# Patient Record
Sex: Female | Born: 1970
Health system: Southern US, Community
[De-identification: ages and names within clinical notes are randomized; demographics above are authoritative.]

## PROBLEM LIST (undated history)

## (undated) DIAGNOSIS — D649 Anemia, unspecified: Secondary | ICD-10-CM

## (undated) DIAGNOSIS — L719 Rosacea, unspecified: Secondary | ICD-10-CM

## (undated) DIAGNOSIS — E7800 Pure hypercholesterolemia, unspecified: Secondary | ICD-10-CM

## (undated) DIAGNOSIS — F419 Anxiety disorder, unspecified: Secondary | ICD-10-CM

## (undated) DIAGNOSIS — G43909 Migraine, unspecified, not intractable, without status migrainosus: Secondary | ICD-10-CM

## (undated) DIAGNOSIS — N946 Dysmenorrhea, unspecified: Secondary | ICD-10-CM

## (undated) HISTORY — DX: Pure hypercholesterolemia, unspecified: E78.00

## (undated) HISTORY — DX: Anemia, unspecified: D64.9

## (undated) HISTORY — DX: Anxiety disorder, unspecified: F41.9

## (undated) HISTORY — DX: Migraine, unspecified, not intractable, without status migrainosus: G43.909

## (undated) HISTORY — DX: Dysmenorrhea, unspecified: N94.6

## (undated) HISTORY — DX: Rosacea, unspecified: L71.9

---
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
## 2002-10-07 ENCOUNTER — Other Ambulatory Visit: Admission: RE | Admit: 2002-10-07 | Discharge: 2002-10-07 | Payer: Self-pay | Admitting: Family Medicine

## 2004-01-03 ENCOUNTER — Other Ambulatory Visit: Admission: RE | Admit: 2004-01-03 | Discharge: 2004-01-03 | Payer: Self-pay | Admitting: Family Medicine

## 2006-05-22 ENCOUNTER — Other Ambulatory Visit: Admission: RE | Admit: 2006-05-22 | Discharge: 2006-05-22 | Payer: Self-pay | Admitting: Family Medicine

## 2006-10-19 ENCOUNTER — Emergency Department (HOSPITAL_COMMUNITY): Admission: EM | Admit: 2006-10-19 | Discharge: 2006-10-19 | Payer: Self-pay | Admitting: Emergency Medicine

## 2007-11-11 ENCOUNTER — Other Ambulatory Visit: Admission: RE | Admit: 2007-11-11 | Discharge: 2007-11-11 | Payer: Self-pay | Admitting: Family Medicine

## 2010-02-07 ENCOUNTER — Other Ambulatory Visit: Admission: RE | Admit: 2010-02-07 | Discharge: 2010-02-07 | Payer: Self-pay | Admitting: Family Medicine

## 2010-08-31 ENCOUNTER — Encounter: Admission: RE | Admit: 2010-08-31 | Discharge: 2010-08-31 | Payer: Self-pay | Admitting: Family Medicine

## 2011-12-30 IMAGING — US US TRANSVAGINAL NON-OB
1 series · 13 of 25 positions shown · non-contrast
Comparison: None.

CLINICAL DATA: Right adnexal mass with abdominal and pelvic
tenderness.  LMP 08/15/2010



[Series 1: us transvaginal non-ob · 0.31mm/px · 13 of 63 slices shown]
[im 1/63]
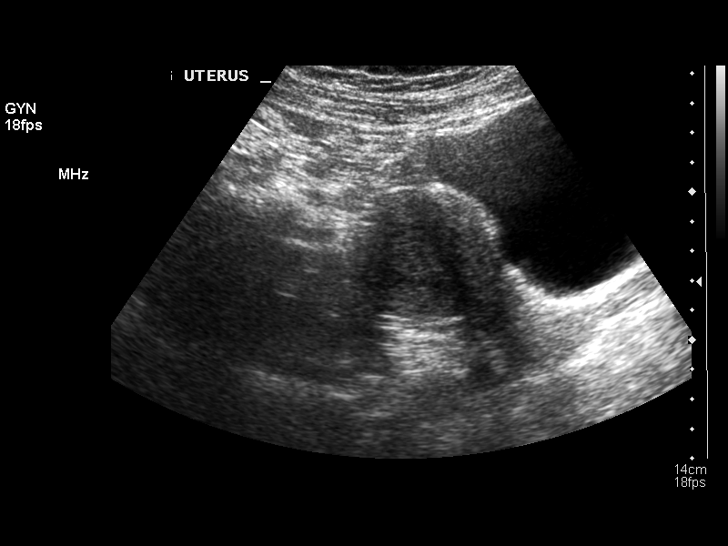
[im 6/63]
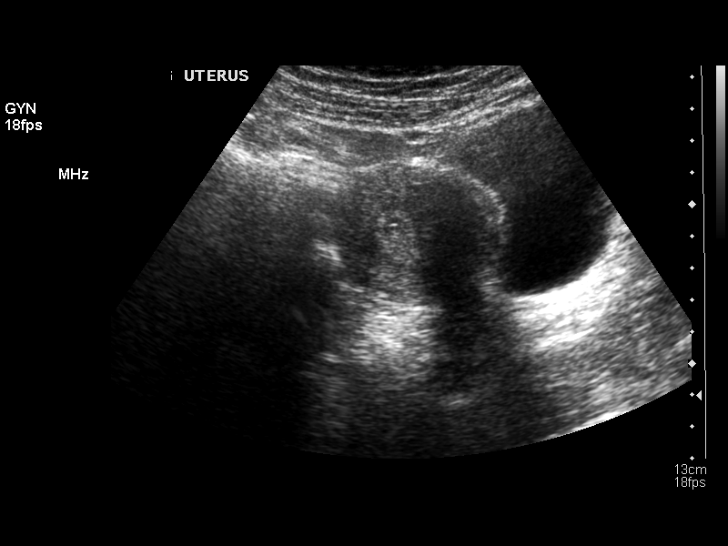
[im 11/63]
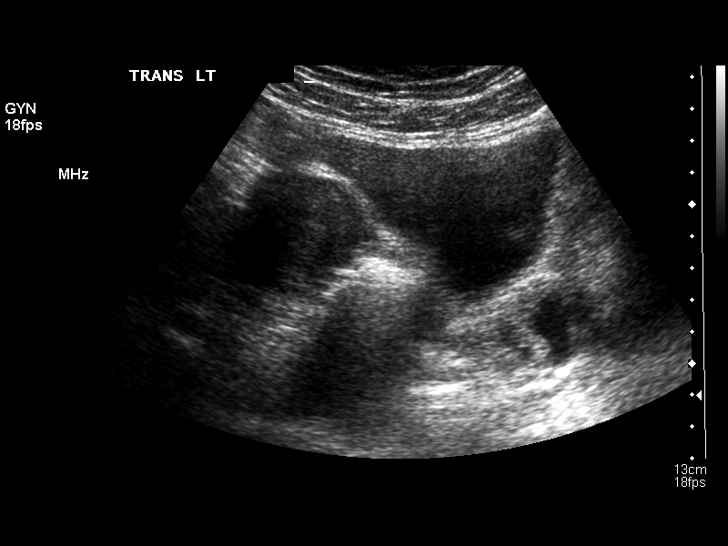
[im 16/63]
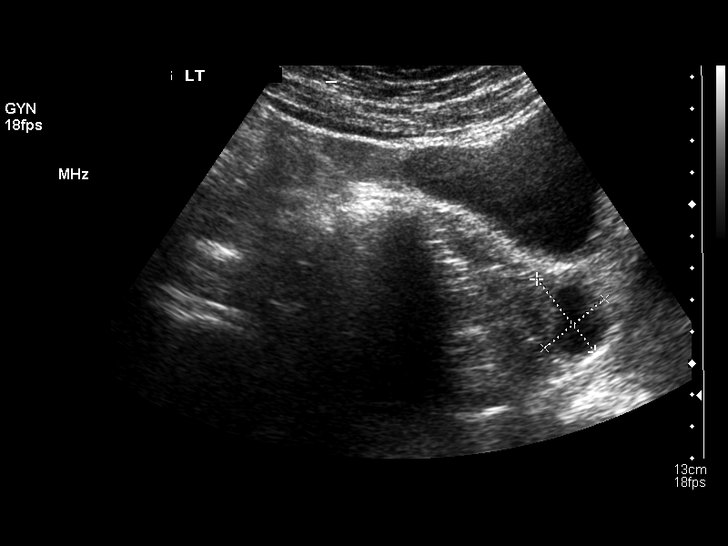
[im 21/63]
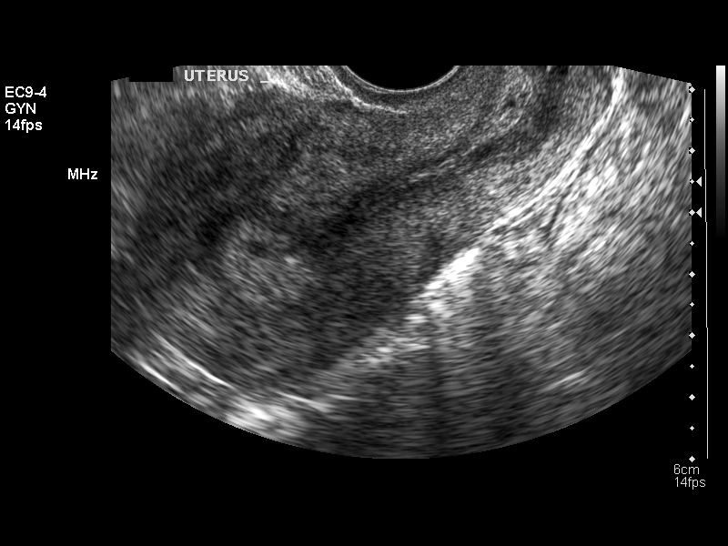
[im 26/63]
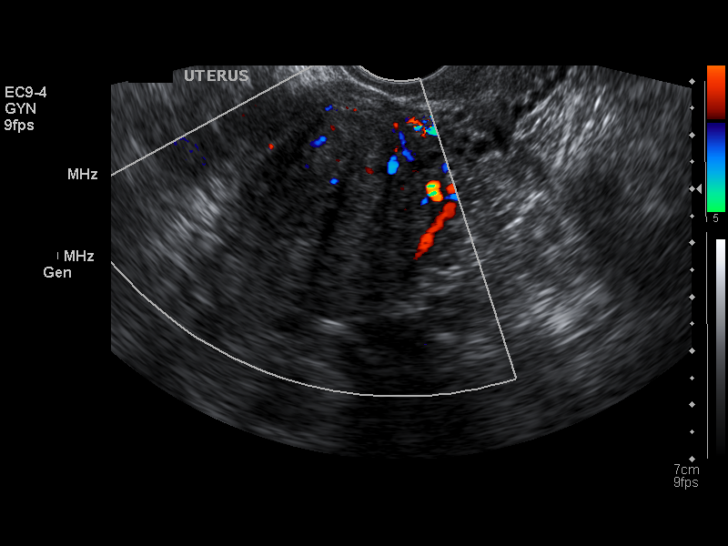
[im 32/63]
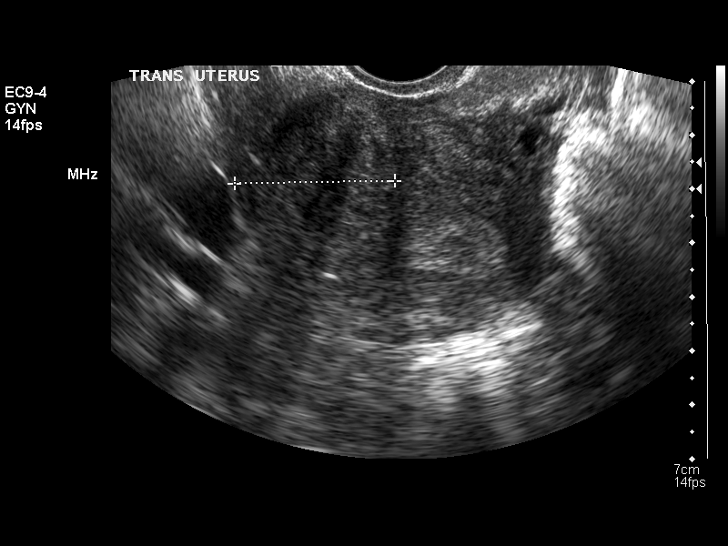
[im 37/63]
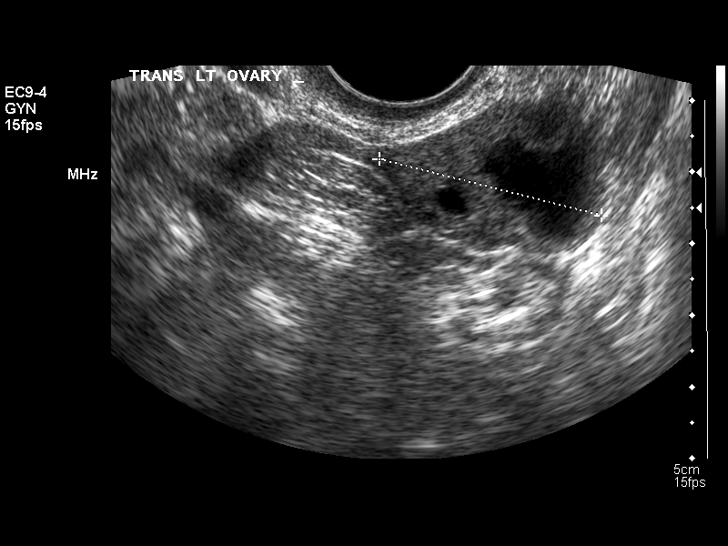
[im 42/63]
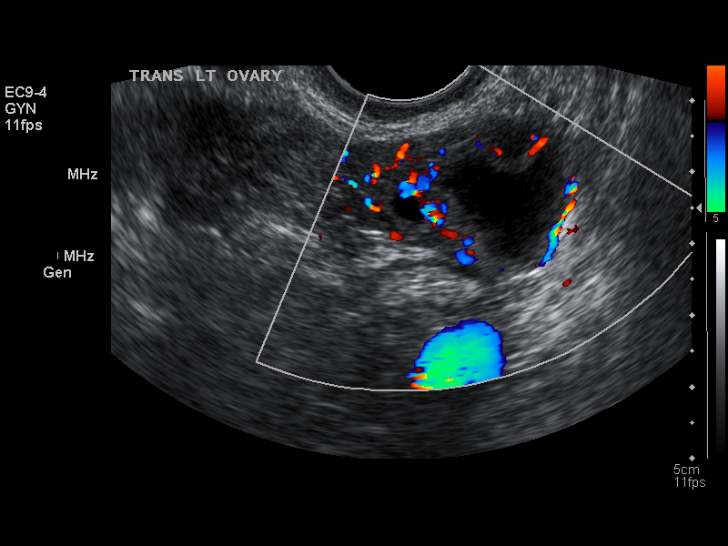
[im 47/63]
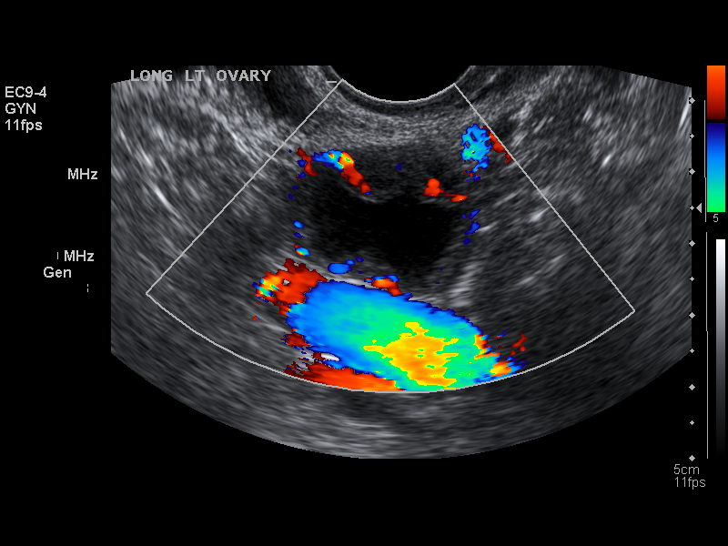
[im 52/63]
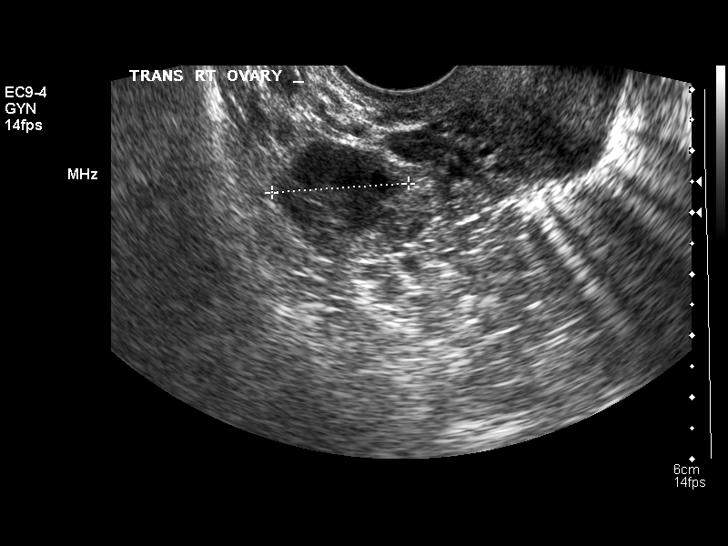
[im 57/63]
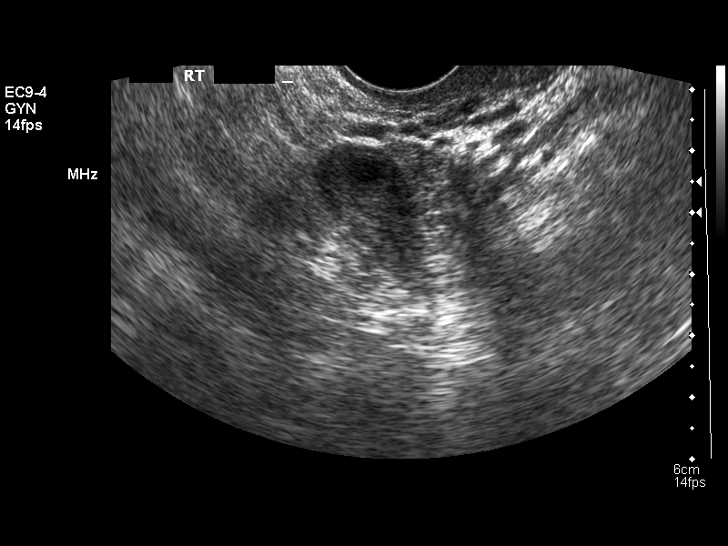
[im 63/63]
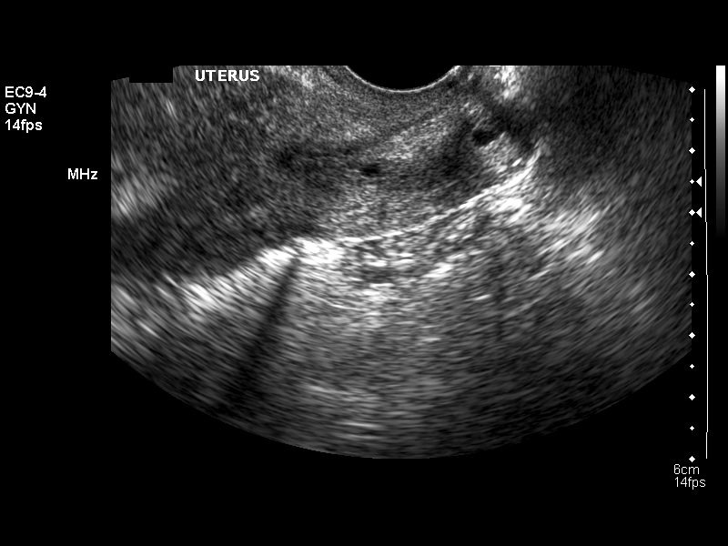

[13 of 25 positions shown; findings below may reference images not displayed]

FINDINGS: Uterus the uterus has a sagittal length of 8.7 cm, an AP depth of
4.7 cm and a transverse width of 6.0 cm.  An area of focally
altered echotexture is identified in the right anterolateral upper
uterine segment measuring 3.5 x 2.7 x 3.0 cm and compatible with a
focal mural fibroid.  The remainder of the myometrium is
homogeneous.

Endometrium has a late tri- layered appearance with an AP width of
10.6 mm.  No areas of focal thickening or heterogeneity are seen
and this would correlate with a late periovulatory endometrial
stripe and correspond with the patient's given LMP of 08/15/2010.

Right Ovary has a normal appearance measuring 2.2 x 2.5 x 1.4 cm

Left Ovary measures 2.8 x 2.2 x 2.2 cm and contains a collapsing
corpus luteum

Other Findings:  No pelvic fluid or separate adnexal masses are
noted.
IMPRESSION: Focal fibroid with sizes location as noted above.

Normal periovulatory endometrial stripe and ovaries.

If the clinical impression of a right adnexal mass persists and is
not felt to correlate with the location or size of the fibroid seen
today, further evaluation of the non gynecologic aspects of the
pelvis can be undertaken with CT.

## 2015-10-31 ENCOUNTER — Other Ambulatory Visit (HOSPITAL_COMMUNITY)
Admission: RE | Admit: 2015-10-31 | Discharge: 2015-10-31 | Disposition: A | Discharge status: Home / Self Care | Payer: 59 | Source: Ambulatory Visit | Attending: Family Medicine | Admitting: Family Medicine

## 2015-10-31 ENCOUNTER — Other Ambulatory Visit: Payer: Self-pay | Admitting: Family Medicine

## 2015-10-31 DIAGNOSIS — Z1151 Encounter for screening for human papillomavirus (HPV): Secondary | ICD-10-CM | POA: Diagnosis present

## 2015-10-31 DIAGNOSIS — Z01419 Encounter for gynecological examination (general) (routine) without abnormal findings: Secondary | ICD-10-CM | POA: Insufficient documentation

## 2015-11-04 LAB — CYTOLOGY - PAP

## 2016-03-15 DIAGNOSIS — L719 Rosacea, unspecified: Secondary | ICD-10-CM | POA: Diagnosis not present

## 2016-03-15 DIAGNOSIS — D1800 Hemangioma unspecified site: Secondary | ICD-10-CM | POA: Diagnosis not present

## 2016-03-15 DIAGNOSIS — L814 Other melanin hyperpigmentation: Secondary | ICD-10-CM | POA: Diagnosis not present

## 2016-03-15 DIAGNOSIS — Z808 Family history of malignant neoplasm of other organs or systems: Secondary | ICD-10-CM | POA: Diagnosis not present

## 2016-08-03 DIAGNOSIS — Z1231 Encounter for screening mammogram for malignant neoplasm of breast: Secondary | ICD-10-CM | POA: Diagnosis not present

## 2016-08-03 DIAGNOSIS — Z803 Family history of malignant neoplasm of breast: Secondary | ICD-10-CM | POA: Diagnosis not present

## 2016-10-10 DIAGNOSIS — Z23 Encounter for immunization: Secondary | ICD-10-CM | POA: Diagnosis not present

## 2016-10-17 DIAGNOSIS — M20011 Mallet finger of right finger(s): Secondary | ICD-10-CM | POA: Diagnosis not present

## 2016-10-19 DIAGNOSIS — M20011 Mallet finger of right finger(s): Secondary | ICD-10-CM | POA: Diagnosis not present

## 2016-11-05 DIAGNOSIS — Z0000 Encounter for general adult medical examination without abnormal findings: Secondary | ICD-10-CM | POA: Diagnosis not present

## 2016-11-05 DIAGNOSIS — E782 Mixed hyperlipidemia: Secondary | ICD-10-CM | POA: Diagnosis not present

## 2016-11-09 DIAGNOSIS — M20011 Mallet finger of right finger(s): Secondary | ICD-10-CM | POA: Diagnosis not present

## 2016-12-28 DIAGNOSIS — M79644 Pain in right finger(s): Secondary | ICD-10-CM | POA: Diagnosis not present

## 2017-01-08 DIAGNOSIS — M20011 Mallet finger of right finger(s): Secondary | ICD-10-CM | POA: Diagnosis not present

## 2017-01-11 DIAGNOSIS — M25641 Stiffness of right hand, not elsewhere classified: Secondary | ICD-10-CM | POA: Diagnosis not present

## 2017-01-11 DIAGNOSIS — M79644 Pain in right finger(s): Secondary | ICD-10-CM | POA: Diagnosis not present

## 2017-01-18 DIAGNOSIS — M20011 Mallet finger of right finger(s): Secondary | ICD-10-CM | POA: Diagnosis not present

## 2017-01-22 DIAGNOSIS — M20011 Mallet finger of right finger(s): Secondary | ICD-10-CM | POA: Diagnosis not present

## 2017-02-05 DIAGNOSIS — M20011 Mallet finger of right finger(s): Secondary | ICD-10-CM | POA: Diagnosis not present

## 2017-03-03 DIAGNOSIS — R509 Fever, unspecified: Secondary | ICD-10-CM | POA: Diagnosis not present

## 2017-03-03 DIAGNOSIS — J028 Acute pharyngitis due to other specified organisms: Secondary | ICD-10-CM | POA: Diagnosis not present

## 2017-03-12 DIAGNOSIS — M20011 Mallet finger of right finger(s): Secondary | ICD-10-CM | POA: Diagnosis not present

## 2017-03-18 DIAGNOSIS — H524 Presbyopia: Secondary | ICD-10-CM | POA: Diagnosis not present

## 2017-03-18 DIAGNOSIS — H02833 Dermatochalasis of right eye, unspecified eyelid: Secondary | ICD-10-CM | POA: Diagnosis not present

## 2017-03-18 DIAGNOSIS — H01003 Unspecified blepharitis right eye, unspecified eyelid: Secondary | ICD-10-CM | POA: Diagnosis not present

## 2017-03-18 DIAGNOSIS — L718 Other rosacea: Secondary | ICD-10-CM | POA: Diagnosis not present

## 2017-03-19 DIAGNOSIS — L814 Other melanin hyperpigmentation: Secondary | ICD-10-CM | POA: Diagnosis not present

## 2017-03-19 DIAGNOSIS — Z808 Family history of malignant neoplasm of other organs or systems: Secondary | ICD-10-CM | POA: Diagnosis not present

## 2017-03-19 DIAGNOSIS — L719 Rosacea, unspecified: Secondary | ICD-10-CM | POA: Diagnosis not present

## 2017-03-19 DIAGNOSIS — D1800 Hemangioma unspecified site: Secondary | ICD-10-CM | POA: Diagnosis not present

## 2017-05-18 DIAGNOSIS — J209 Acute bronchitis, unspecified: Secondary | ICD-10-CM | POA: Diagnosis not present

## 2017-05-18 DIAGNOSIS — J309 Allergic rhinitis, unspecified: Secondary | ICD-10-CM | POA: Diagnosis not present

## 2017-10-11 DIAGNOSIS — Z1231 Encounter for screening mammogram for malignant neoplasm of breast: Secondary | ICD-10-CM | POA: Diagnosis not present

## 2017-11-25 DIAGNOSIS — Z23 Encounter for immunization: Secondary | ICD-10-CM | POA: Diagnosis not present

## 2017-11-25 DIAGNOSIS — Z0000 Encounter for general adult medical examination without abnormal findings: Secondary | ICD-10-CM | POA: Diagnosis not present

## 2017-11-25 DIAGNOSIS — G479 Sleep disorder, unspecified: Secondary | ICD-10-CM | POA: Diagnosis not present

## 2017-11-25 DIAGNOSIS — E669 Obesity, unspecified: Secondary | ICD-10-CM | POA: Diagnosis not present

## 2018-03-24 DIAGNOSIS — Z808 Family history of malignant neoplasm of other organs or systems: Secondary | ICD-10-CM | POA: Diagnosis not present

## 2018-03-24 DIAGNOSIS — L821 Other seborrheic keratosis: Secondary | ICD-10-CM | POA: Diagnosis not present

## 2018-03-24 DIAGNOSIS — D1800 Hemangioma unspecified site: Secondary | ICD-10-CM | POA: Diagnosis not present

## 2018-03-24 DIAGNOSIS — L814 Other melanin hyperpigmentation: Secondary | ICD-10-CM | POA: Diagnosis not present

## 2018-09-08 DIAGNOSIS — Z23 Encounter for immunization: Secondary | ICD-10-CM | POA: Diagnosis not present

## 2018-10-10 DIAGNOSIS — R42 Dizziness and giddiness: Secondary | ICD-10-CM | POA: Diagnosis not present

## 2018-10-10 DIAGNOSIS — H9313 Tinnitus, bilateral: Secondary | ICD-10-CM | POA: Diagnosis not present

## 2018-10-10 DIAGNOSIS — H6120 Impacted cerumen, unspecified ear: Secondary | ICD-10-CM | POA: Diagnosis not present

## 2018-10-10 DIAGNOSIS — H6983 Other specified disorders of Eustachian tube, bilateral: Secondary | ICD-10-CM | POA: Diagnosis not present

## 2018-10-14 DIAGNOSIS — G43809 Other migraine, not intractable, without status migrainosus: Secondary | ICD-10-CM | POA: Diagnosis not present

## 2018-10-15 DIAGNOSIS — Z1231 Encounter for screening mammogram for malignant neoplasm of breast: Secondary | ICD-10-CM | POA: Diagnosis not present

## 2018-10-15 DIAGNOSIS — Z803 Family history of malignant neoplasm of breast: Secondary | ICD-10-CM | POA: Diagnosis not present

## 2018-10-28 DIAGNOSIS — M722 Plantar fascial fibromatosis: Secondary | ICD-10-CM | POA: Diagnosis not present

## 2018-12-22 DIAGNOSIS — E782 Mixed hyperlipidemia: Secondary | ICD-10-CM | POA: Diagnosis not present

## 2018-12-22 DIAGNOSIS — Z0000 Encounter for general adult medical examination without abnormal findings: Secondary | ICD-10-CM | POA: Diagnosis not present

## 2018-12-22 DIAGNOSIS — N912 Amenorrhea, unspecified: Secondary | ICD-10-CM | POA: Diagnosis not present

## 2018-12-22 LAB — BASIC METABOLIC PANEL
BUN: 10 (ref 4–21)
CO2: 28 — AB (ref 13–22)
Chloride: 104 (ref 99–108)
Creatinine: 0.7 (ref 0.5–1.1)
Glucose: 77
Potassium: 4.5 (ref 3.4–5.3)
Sodium: 140 (ref 137–147)

## 2018-12-22 LAB — LIPID PANEL
Cholesterol: 230 — AB (ref 0–200)
HDL: 38 (ref 35–70)
LDL Cholesterol: 162
LDl/HDL Ratio: 6
Triglycerides: 147 (ref 40–160)

## 2018-12-22 LAB — HEPATIC FUNCTION PANEL
ALT: 55 — AB (ref 7–35)
AST: 39 — AB (ref 13–35)
Alkaline Phosphatase: 75 (ref 25–125)
Bilirubin, Total: 0.6

## 2018-12-22 LAB — COMPREHENSIVE METABOLIC PANEL
Albumin: 4.5 (ref 3.5–5.0)
Calcium: 9.6 (ref 8.7–10.7)

## 2018-12-22 LAB — TSH: TSH: 1.56 (ref 0.41–5.90)

## 2019-07-14 DIAGNOSIS — Z03818 Encounter for observation for suspected exposure to other biological agents ruled out: Secondary | ICD-10-CM | POA: Diagnosis not present

## 2019-07-14 DIAGNOSIS — Z7189 Other specified counseling: Secondary | ICD-10-CM | POA: Diagnosis not present

## 2019-08-07 DIAGNOSIS — Z7189 Other specified counseling: Secondary | ICD-10-CM | POA: Diagnosis not present

## 2019-08-07 DIAGNOSIS — Z20828 Contact with and (suspected) exposure to other viral communicable diseases: Secondary | ICD-10-CM | POA: Diagnosis not present

## 2019-08-22 DIAGNOSIS — Z23 Encounter for immunization: Secondary | ICD-10-CM | POA: Diagnosis not present

## 2019-09-08 DIAGNOSIS — Z808 Family history of malignant neoplasm of other organs or systems: Secondary | ICD-10-CM | POA: Diagnosis not present

## 2019-09-08 DIAGNOSIS — D1800 Hemangioma unspecified site: Secondary | ICD-10-CM | POA: Diagnosis not present

## 2019-09-08 DIAGNOSIS — Z23 Encounter for immunization: Secondary | ICD-10-CM | POA: Diagnosis not present

## 2019-09-08 DIAGNOSIS — L821 Other seborrheic keratosis: Secondary | ICD-10-CM | POA: Diagnosis not present

## 2019-09-08 DIAGNOSIS — L814 Other melanin hyperpigmentation: Secondary | ICD-10-CM | POA: Diagnosis not present

## 2019-10-28 ENCOUNTER — Encounter: Payer: Self-pay | Admitting: Obstetrics and Gynecology

## 2019-10-28 DIAGNOSIS — Z1231 Encounter for screening mammogram for malignant neoplasm of breast: Secondary | ICD-10-CM | POA: Diagnosis not present

## 2019-10-28 DIAGNOSIS — Z803 Family history of malignant neoplasm of breast: Secondary | ICD-10-CM | POA: Diagnosis not present

## 2019-10-28 LAB — HM MAMMOGRAPHY

## 2019-12-11 DIAGNOSIS — R7989 Other specified abnormal findings of blood chemistry: Secondary | ICD-10-CM

## 2019-12-11 HISTORY — DX: Other specified abnormal findings of blood chemistry: R79.89

## 2020-01-04 ENCOUNTER — Other Ambulatory Visit: Payer: Self-pay

## 2020-01-05 ENCOUNTER — Encounter: Payer: Self-pay | Admitting: Obstetrics and Gynecology

## 2020-01-05 ENCOUNTER — Ambulatory Visit: Payer: BC Managed Care – PPO | Admitting: Obstetrics and Gynecology

## 2020-01-05 ENCOUNTER — Other Ambulatory Visit (HOSPITAL_COMMUNITY)
Admission: RE | Admit: 2020-01-05 | Discharge: 2020-01-05 | Disposition: A | Discharge status: Home / Self Care | Payer: BC Managed Care – PPO | Source: Ambulatory Visit | Attending: Obstetrics and Gynecology | Admitting: Obstetrics and Gynecology

## 2020-01-05 VITALS — BP 128/80 | HR 88 | Temp 96.3°F | Resp 16 | Ht 68.0 in | Wt 211.6 lb

## 2020-01-05 DIAGNOSIS — N841 Polyp of cervix uteri: Secondary | ICD-10-CM | POA: Diagnosis not present

## 2020-01-05 DIAGNOSIS — R7989 Other specified abnormal findings of blood chemistry: Secondary | ICD-10-CM

## 2020-01-05 DIAGNOSIS — N951 Menopausal and female climacteric states: Secondary | ICD-10-CM

## 2020-01-05 DIAGNOSIS — Z01419 Encounter for gynecological examination (general) (routine) without abnormal findings: Secondary | ICD-10-CM | POA: Diagnosis not present

## 2020-01-05 NOTE — Progress Notes (Signed)
49 y.o. G51P0012 Married Caucasian female here for annual exam.    She was told she was not in menopause, and she did not take Provera due to concerns about hormonal treatment and risk.  She has some hot flashes but they are lessening.   Does have some post coital bleeding occasionally.   She wants to work on weight loss. She did well with [employer] in the past.   Hx migraines in her 35s when she was on pills.   Her sister had a DVT on birth control pills.  No known testing.   Moved from [place] 18 years ago.  Married.  2 children.   PCP:  Cari Caraway, MD   Patient's last menstrual period was 12/10/2017 (approximate).     Period Cycle (Days): (no cycle in 2 years)     Sexually active: Yes.    The current method of family planning is vasectomy.    Exercising: No.  The patient does not participate in regular exercise at present. Smoker:  no  Health Maintenance: Pap: 10-31-15 pap Neg:Neg HR HPV, 02/07/10 - normal, neg HR HPV. History of abnormal Pap:  no MMG: 10/2019 normal per patient--[facility] Colonoscopy:  n/a BMD:   n/a  Result  n/a TDaP: Unsure.  She will check on this.  Gardasil:   no HIV:1991 Neg Hep C:  Donated blood in December.  Screening Labs:  Today.  Flu vaccine:  Completed.    reports that she has never smoked. She has never used smokeless tobacco. She reports current alcohol use of about 3.0 - 4.0 standard drinks of alcohol per week. She reports that she does not use drugs.  Past Medical History:  Diagnosis Date  . Anemia    as a teenager and during pregnancy  . Anxiety   . Dysmenorrhea   . Hypercholesteremia   . Migraine    with aura  . Rosacea     Past Surgical History:  Procedure Laterality Date  . NASAL SEPTUM SURGERY  1984    No current outpatient medications on file.   No current facility-administered medications for this visit.    Family History  Problem Relation Age of Onset  . Hypertension Mother   . Hyperlipidemia Mother   .  Diabetes Father   . Atrial fibrillation Father   . Breast cancer Paternal Aunt 10  . Hyperlipidemia Maternal Grandmother   . Thyroid disease Maternal Grandmother   . Hyperlipidemia Maternal Grandfather   . Stroke Maternal Grandfather   . Pulmonary embolism Paternal Grandmother   . Thyroid disease Paternal Grandmother     Review of Systems  All other systems reviewed and are negative.   Exam:   BP 128/80 (Cuff Size: Large)   Pulse 88   Temp (!) 96.3 F (35.7 C) (Temporal)   Resp 16   Ht 5\' 8"  (1.727 m)   Wt 211 lb 9.6 oz (96 kg)   LMP 12/10/2017 (Approximate)   BMI 32.17 kg/m     General appearance: alert, cooperative and appears stated age Head: normocephalic, without obvious abnormality, atraumatic Neck: no adenopathy, supple, symmetrical, trachea midline and thyroid normal to inspection and palpation Lungs: clear to auscultation bilaterally Breasts: normal appearance, no masses or tenderness, No nipple retraction or dimpling, No nipple discharge or bleeding, No axillary adenopathy Heart: regular rate and rhythm Abdomen: soft, non-tender; no masses, no organomegaly Extremities: extremities normal, atraumatic, no cyanosis or edema Skin: skin color, texture, turgor normal. No rashes or lesions Lymph nodes: cervical, supraclavicular,  and axillary nodes normal. Neurologic: grossly normal  Pelvic: External genitalia:  no lesions              No abnormal inguinal nodes palpated.              Urethra:  normal appearing urethra with no masses, tenderness or lesions              Bartholins and Skenes: normal                 Vagina: normal appearing vagina with normal color and discharge, no lesions              Cervix: no lesions.  Small cervical polyp removed with dressing forceps after patient gave verbal permission.               Pap taken: No. Bimanual Exam:  Uterus:  normal size, contour, position, consistency, mobility, non-tender              Adnexa: no mass, fullness,  tenderness              Rectal exam: Yes.  .  Confirms.              Anus:  normal sphincter tone, no lesions  Chaperone was present for exam.  Assessment:   Well woman visit with normal exam. Postmenopausal female.  Hx migraine with aura.  Family and personal history of hyperlipidemia.  Cervical polyp.   Plan: Mammogram screening discussed. Self breast awareness reviewed. Pap and HR HPV as above. Guidelines for Calcium, Vitamin D, regular exercise program including cardiovascular and weight bearing exercise. Colonoscopy discussed.  Routine labs.  Check FSH and E2.  Cervical polyp to pathology.  Follow up annually and prn.   After visit summary provided.

## 2020-01-05 NOTE — Patient Instructions (Signed)

## 2020-01-06 ENCOUNTER — Encounter: Payer: Self-pay | Admitting: Obstetrics and Gynecology

## 2020-01-06 ENCOUNTER — Telehealth: Payer: Self-pay | Admitting: Obstetrics and Gynecology

## 2020-01-06 LAB — COMPREHENSIVE METABOLIC PANEL
ALT: 68 IU/L — ABNORMAL HIGH (ref 0–32)
AST: 49 IU/L — ABNORMAL HIGH (ref 0–40)
Albumin/Globulin Ratio: 1.6 (ref 1.2–2.2)
Albumin: 4.5 g/dL (ref 3.8–4.8)
Alkaline Phosphatase: 93 IU/L (ref 39–117)
BUN/Creatinine Ratio: 17 (ref 9–23)
BUN: 13 mg/dL (ref 6–24)
Bilirubin Total: 0.4 mg/dL (ref 0.0–1.2)
CO2: 21 mmol/L (ref 20–29)
Calcium: 9.6 mg/dL (ref 8.7–10.2)
Chloride: 104 mmol/L (ref 96–106)
Creatinine, Ser: 0.75 mg/dL (ref 0.57–1.00)
GFR calc Af Amer: 108 mL/min/{1.73_m2} (ref >59)
GFR calc non Af Amer: 94 mL/min/{1.73_m2} (ref >59)
Globulin, Total: 2.9 g/dL (ref 1.5–4.5)
Glucose: 79 mg/dL (ref 65–99)
Potassium: 4.6 mmol/L (ref 3.5–5.2)
Sodium: 139 mmol/L (ref 134–144)
Total Protein: 7.4 g/dL (ref 6.0–8.5)

## 2020-01-06 LAB — CBC
Hematocrit: 41.9 % (ref 34.0–46.6)
Hemoglobin: 14.1 g/dL (ref 11.1–15.9)
MCH: 30.7 pg (ref 26.6–33.0)
MCHC: 33.7 g/dL (ref 31.5–35.7)
MCV: 91 fL (ref 79–97)
Platelets: 284 10*3/uL (ref 150–450)
RBC: 4.6 x10E6/uL (ref 3.77–5.28)
RDW: 12.2 % (ref 11.7–15.4)
WBC: 7.1 10*3/uL (ref 3.4–10.8)

## 2020-01-06 LAB — LIPID PANEL
Chol/HDL Ratio: 6.5 ratio — ABNORMAL HIGH (ref 0.0–4.4)
Cholesterol, Total: 254 mg/dL — ABNORMAL HIGH (ref 100–199)
HDL: 39 mg/dL — ABNORMAL LOW (ref >39)
LDL Chol Calc (NIH): 190 mg/dL — ABNORMAL HIGH (ref 0–99)
Triglycerides: 134 mg/dL (ref 0–149)
VLDL Cholesterol Cal: 25 mg/dL (ref 5–40)

## 2020-01-06 LAB — ESTRADIOL: Estradiol: <5 pg/mL

## 2020-01-06 LAB — VITAMIN D 25 HYDROXY (VIT D DEFICIENCY, FRACTURES): Vit D, 25-Hydroxy: 10.5 ng/mL — ABNORMAL LOW (ref 30.0–100.0)

## 2020-01-06 LAB — TSH: TSH: 1.38 u[IU]/mL (ref 0.450–4.500)

## 2020-01-06 LAB — FOLLICLE STIMULATING HORMONE: FSH: 51.1 m[IU]/mL

## 2020-01-06 LAB — SURGICAL PATHOLOGY

## 2020-01-06 NOTE — Telephone Encounter (Signed)
Encounter closed

## 2020-01-06 NOTE — Telephone Encounter (Signed)
Patient sent the following correspondence through MyChart.  Dr. Edward Jolly:  I was speaking with my father this morning, and he informed me that he has autoimmune hepatitis, diagnosed 3 years ago.  I thought it might be important for my chart.  Thank you!  Laura Clark

## 2020-01-06 NOTE — Telephone Encounter (Signed)
Patient was seen in office on 01/05/20 for AEX, routing to Dr. Edward Jolly.

## 2020-01-06 NOTE — Telephone Encounter (Signed)
I added this to her family history.  You may close the encounter.

## 2020-01-07 ENCOUNTER — Other Ambulatory Visit: Payer: Self-pay

## 2020-01-07 ENCOUNTER — Encounter: Payer: Self-pay | Admitting: Obstetrics and Gynecology

## 2020-01-07 DIAGNOSIS — R7989 Other specified abnormal findings of blood chemistry: Secondary | ICD-10-CM

## 2020-01-07 MED ORDER — VITAMIN D (ERGOCALCIFEROL) 1.25 MG (50000 UNIT) PO CAPS: 50000.0000 [IU] | ORAL_CAPSULE | ORAL | 0 refills | Status: DC

## 2020-01-07 NOTE — Addendum Note (Signed)
Addended by: Ardell Isaacs, Debbe Bales E on: 01/07/2020 06:55 AM   Modules accepted: Orders

## 2020-01-07 NOTE — Progress Notes (Signed)
Vitamin D3 50000 IU Rx sent to pharmacy on file. #12, 0RF.  Encounter closed.

## 2020-01-13 ENCOUNTER — Encounter: Payer: Self-pay | Admitting: Family Medicine

## 2020-01-13 ENCOUNTER — Ambulatory Visit: Payer: BC Managed Care – PPO | Admitting: Family Medicine

## 2020-01-13 ENCOUNTER — Other Ambulatory Visit: Payer: Self-pay

## 2020-01-13 VITALS — BP 128/80 | HR 91 | Resp 12 | Ht 68.0 in | Wt 211.0 lb

## 2020-01-13 DIAGNOSIS — Z6832 Body mass index (BMI) 32.0-32.9, adult: Secondary | ICD-10-CM

## 2020-01-13 DIAGNOSIS — E6609 Other obesity due to excess calories: Secondary | ICD-10-CM

## 2020-01-13 DIAGNOSIS — E785 Hyperlipidemia, unspecified: Secondary | ICD-10-CM | POA: Diagnosis not present

## 2020-01-13 DIAGNOSIS — E559 Vitamin D deficiency, unspecified: Secondary | ICD-10-CM

## 2020-01-13 DIAGNOSIS — R7401 Elevation of levels of liver transaminase levels: Secondary | ICD-10-CM | POA: Diagnosis not present

## 2020-01-13 DIAGNOSIS — M533 Sacrococcygeal disorders, not elsewhere classified: Secondary | ICD-10-CM | POA: Diagnosis not present

## 2020-01-13 NOTE — Assessment & Plan Note (Signed)
Chronic. I do not think imaging is needed for now given the fact there is no hx of recent trauma and stable.

## 2020-01-13 NOTE — Assessment & Plan Note (Addendum)
Continue ergocalciferol 50,000 units weekly. She has an lab appointment already arranged for 25 OH vit D.

## 2020-01-13 NOTE — Patient Instructions (Addendum)
.A few things to remember from today's visit:   Elevated transaminase level  Hyperlipidemia, unspecified hyperlipidemia type  Class 1 obesity due to excess calories without serious comorbidity with body mass index (BMI) of 32.0 to 32.9 in adult   Contenido de colesterol en los alimentos Cholesterol Content in Foods El colesterol es una sustancia cerosa, parecida a la grasa, que contribuye a transportar la grasa en la [place]. El cuerpo Occupational hygienist en pequeas cantidades, pero el exceso de colesterol puede causar dao en las arterias y Film/video editor. La mayora de las personas debera consumir menos de 200 miligramos (mg) de Special educational needs teacher. Alimentos con colesterol  El colesterol se encuentra en los alimentos de origen animal, como la carne, los pescados y mariscos y los productos lcteos. En general, los productos lcteos descremados y las carnes magras tienen menos colesterol que los productos lcteos enteros y las carnes grasas. A continuacin, se enumeran los miligramos de colesterol por porcin (mg por porcin) de los alimentos comunes que contienen colesterol. Carne y otras protenas  Huevo: un huevo entero grande tiene 186mg .  Pata de ternera: 4 onzas (115g) tienen 141mg .  Carne picada magra de pavo (93% magra): 4 onzas (115g) tienen 118mg .  Lomo de cordero desgrasado: 4 onzas (115g) tienen 106mg .  Carne picada magra de res (90% magra): 4 onzas (115g) tienen 100mg .  [place]osta: 3,5 onzas (100g) tienen 90mg .  Chuletas de cerdo: 4 onzas (115g) tienen 86mg .  Salmn enlatado: 3,5 onzas (100g) tienen 83mg .  Lomo de res desgrasado: 4 onzas (115g) tienen 78mg .  Salchicha: 1 salchicha (3,5 onzas o 100g) tiene 77mg .  Cangrejo: 3,5 onzas (100g) tienen 71mg .  Pollo asado sin piel, carne blanca: 4 onzas (115g) tienen 66mg .  Salchichn light: 2 onzas (57g) tienen 45mg .  Fiambre de pavita: 2 onzas (57g) tienen 31mg .  Atn enlatado: 3,5 onzas  (100g) tienen 31mg .  Tocino: 1 onza (28g) tiene 29mg .  Ostras y mejillones (crudos): 3,5 onzas (100g) tienen 25mg .  Caballa: 1 onza (28g) tiene 22mg .  Trucha: 1 onza (28g) tiene 20mg .  Salchicha de cerdo: 1 salchicha (1 onza o 28g) tiene 17mg .  Salmn: 1 onza (28g) tiene 16mg .  Tilapia: 1 onza (28g) tiene 14 mg. Lcteos  Helado cremoso:  taza (4 onzas o 115g) tiene 103mg .  Yogur entero: 1 taza (8 onzas o 227g) tiene 29mg .  Queso cheddar: 1onza (28g) tiene 28mg .  Queso americano: 1 onza (28g) tiene 28mg .  Leche entera: 1 taza (8 onzas o 227g) tiene 23mg .  Leche con contenido reducido de grasa (2%): 1 taza (8 onzas o 227g) tiene 18mg .  Queso crema: 1 cucharada tiene 15mg .  Requesn:  taza (4 onzas o 115g) tiene 14mg .  Leche con bajo contenido de grasa (1%): 1 taza (8 onzas o 227g) tiene 10mg .  Crema cida: 1 cucharada tiene 8,5mg .  Yogur con bajo contenido de grasa: 1 taza (8 onzas o 227g) tiene 8mg .  Yogur griego sin contenido de grasa: 1 taza (8 onzas o 227g) tiene 7mg .  Crema de [place]: 1 cucharada tiene 5mg . Grasas y aceites  Aceite de hgado de bacalao: 1 cucharada tiene 82mg .  Manteca: 1 cucharada tiene 15mg .  Grasa de cerdo: 1 cucharada tiene 14mg .  Grasa de tocino: 1 cucharada tiene 14mg .  Mayonesa: 1 cucharada tiene de 5 a 10mg .  Margarina: 1 cucharada tiene de 3 a 10mg . Las cantidades exactas de colesterol que contienen estos alimentos pueden variar dependiendo de los ingredientes especficos y las [place]. Alimentos sin colesterol La Delorise Shiner de  los alimentos a base de plantas no tiene colesterol, a menos que se combinen con un alimento que s tiene Oncologist. Algunos alimentos sin colesterol son los siguientes:  Granos y Medical laboratory scientific officer.  Verduras.  Nils Pyle.  Aceites vegetales, como el de [place], de canola y de [place].  Legumbres, [employer], los frijoles y las lentejas.  Frutos secos y  semillas.  Claras de huevo. Resumen  El cuerpo necesita colesterol en pequeas cantidades, pero el exceso de colesterol puede causar dao en las arterias y Insurance underwriter.  La mayora de las personas debera consumir menos de 200 miligramos (mg) de Teacher, early years/pre. Esta informacin no tiene Theme park manager el consejo del mdico. Asegrese de hacerle al mdico cualquier pregunta que tenga. Document Revised: 10/25/2017 Document Reviewed: 10/25/2017 Elsevier Patient Education  2020 [employer].  Please be sure medication list is accurate. If a new problem present, please set up appointment sooner than planned today.       '

## 2020-01-13 NOTE — Progress Notes (Signed)
HPI:   Laura Clark is a 49 y.o. female, who is here today to establish care.  Former PCP: N/A Last preventive routine visit: Gyn routine exam on 01/05/20.  Chronic medical problems: HLD.   Concerns today: HLD,obesity,and recently she had abnormal LFT's.  HLD: She is trying to work on low fat diet. No hx of diabetes,HTN,or CAD.  Lab Results  Component Value Date   CHOL 254 (H) 01/05/2020   HDL 39 (L) 01/05/2020   LDLCALC 190 (H) 01/05/2020   TRIG 134 01/05/2020   CHOLHDL 6.5 (H) 01/05/2020   She has not been consistent with regular physical activity since COVID 19 pandemia started. Negative for chest pain, dyspnea, palpitation, claudication, focal weakness, or edema.  Elevated transaminases: No prior hx. No hx of high alcohol intake. Negative for abdominal pain, nausea, vomiting,or jaundice.  Lab Results  Component Value Date   ALT 68 (H) 01/05/2020   AST 49 (H) 01/05/2020   ALKPHOS 93 01/05/2020   BILITOT 0.4 01/05/2020    Father hx of auto immune hepatitis.   She is also having coccygeal pain when prolonged sitting. No recent trauma but had some falls on ice when younger. No numbness,tingling,local edema,or erythema.  Vitamin D deficiency: Currently she is on ergocalciferol 50,000 units weekly. Last 25 OH vitamin D on 01/05/2020 was low at 10.5  Review of Systems  Constitutional: Negative for activity change, appetite change, fatigue and fever.  HENT: Negative for mouth sores, nosebleeds and sore throat.   Eyes: Negative for redness and visual disturbance.  Cardiovascular: Negative for chest pain and palpitations.  Gastrointestinal: Negative for abdominal distention.       Negative for changes in bowel habits.  Endocrine: Negative for cold intolerance, heat intolerance, polydipsia, polyphagia and polyuria.  Genitourinary: Negative for decreased urine volume and hematuria.  Musculoskeletal: Negative for gait problem.  Skin: Negative for rash and  wound.  Neurological: Negative for syncope and headaches.  Rest see pertinent positives and negatives per HPI.  Current Outpatient Medications on File Prior to Visit  Medication Sig Dispense Refill  . Vitamin D, Ergocalciferol, (DRISDOL) 1.25 MG (50000 UNIT) CAPS capsule Take 1 capsule (50,000 Units total) by mouth every 7 (seven) days. 12 capsule 0   No current facility-administered medications on file prior to visit.   Past Medical History:  Diagnosis Date  . Anemia    as a teenager and during pregnancy  . Anxiety   . Dysmenorrhea   . Elevated liver function tests 2021  . Hypercholesteremia   . Low vitamin D level 2021  . Migraine    with aura  . Rosacea    No Known Allergies  Family History  Problem Relation Age of Onset  . Hypertension Mother   . Hyperlipidemia Mother   . Arthritis Mother   . Diabetes Father   . Atrial fibrillation Father   . Hepatitis Father        autoimmune  . Asthma Father   . Varicose Veins Father   . Breast cancer Paternal Aunt 40  . Hyperlipidemia Maternal Grandmother   . Thyroid disease Maternal Grandmother   . Hyperlipidemia Maternal Grandfather   . Stroke Maternal Grandfather   . Pulmonary embolism Paternal Grandmother   . Thyroid disease Paternal Grandmother   . Varicose Veins Paternal Grandmother   . Asthma Son   . Diabetes Son   . Asthma Sister   . Asthma Daughter   . Cancer Paternal Aunt  Social History   Socioeconomic History  . Marital status: Married    Spouse name: Not on file  . Number of children: Not on file  . Years of education: Not on file  . Highest education level: Not on file  Occupational History  . Not on file  Tobacco Use  . Smoking status: Never Smoker  . Smokeless tobacco: Never Used  Substance and Sexual Activity  . Alcohol use: Yes    Alcohol/week: 3.0 - 4.0 standard drinks    Types: 3 - 4 Glasses of wine per week  . Drug use: Never  . Sexual activity: Yes    Birth control/protection:  Other-see comments    Comment: Vasectomy  Other Topics Concern  . Not on file  Social History Narrative  . Not on file   Social Determinants of Health   Financial Resource Strain:   . Difficulty of Paying Living Expenses: Not on file  Food Insecurity:   . Worried About Programme researcher, broadcasting/film/video in the Last Year: Not on file  . Ran Out of Food in the Last Year: Not on file  Transportation Needs:   . Lack of Transportation (Medical): Not on file  . Lack of Transportation (Non-Medical): Not on file  Physical Activity:   . Days of Exercise per Week: Not on file  . Minutes of Exercise per Session: Not on file  Stress:   . Feeling of Stress : Not on file  Social Connections:   . Frequency of Communication with Friends and Family: Not on file  . Frequency of Social Gatherings with Friends and Family: Not on file  . Attends Religious Services: Not on file  . Active Member of Clubs or Organizations: Not on file  . Attends Banker Meetings: Not on file  . Marital Status: Not on file    Vitals:   01/13/20 1137  BP: 128/80  Pulse: 91  Resp: 12  SpO2: 97%   Body mass index is 32.08 kg/m.  Physical Exam  Nursing note and vitals reviewed. Constitutional: She is oriented to person, place, and time. She appears well-developed. No distress.  HENT:  Head: Normocephalic and atraumatic.  Mouth/Throat: Oropharynx is clear and moist and mucous membranes are normal.  Eyes: Pupils are equal, round, and reactive to light. Conjunctivae are normal.  Cardiovascular: Normal rate and regular rhythm.  No murmur heard. Pulses:      Dorsalis pedis pulses are 2+ on the right side and 2+ on the left side.  Respiratory: Effort normal and breath sounds normal. No respiratory distress.  GI: Soft. She exhibits no mass. There is no hepatomegaly. There is no abdominal tenderness.  Musculoskeletal:        General: No edema.  Lymphadenopathy:    She has no cervical adenopathy.  Neurological: She  is alert and oriented to person, place, and time. She has normal strength. No cranial nerve deficit. Gait normal.  Skin: Skin is warm. No rash noted. No erythema.  Psychiatric: She has a normal mood and affect.  Well groomed, good eye contact.   ASSESSMENT AND PLAN:  Ms. Louie was seen today for establish care.  Diagnoses and all orders for this visit:  The 10-year ASCVD risk score Denman George DC Jr., et al., 2013) is: 2.5%   Values used to calculate the score:     Age: 36 years     Sex: Female     Is Non-Hispanic African American: No     Diabetic: No  Tobacco smoker: No     Systolic Blood Pressure: 128 mmHg     Is BP treated: No     HDL Cholesterol: 39 mg/dL     Total Cholesterol: 254 mg/dL  Class 1 obesity due to excess calories without serious comorbidity with body mass index (BMI) of 32.0 to 32.9 in adult We discussed benefits of wt loss as well as adverse effects of obesity. Consistency with healthy diet and physical activity recommended. Daily brisk walking for 15-30 min as tolerated or U Tube classes, low impact.   Elevated transaminase level Mild. Possible etiology discussed,?  Fatty liver. We will plan on repeating LFTs next visit + CRP + hepatitis screening. Low-fat diet and weight loss will help. Caution with alcohol intake, we discussed current recommendations for women.    Hyperlipidemia We discussed current guidelines in regard to pharmacologic treatment. 10 years CVD score 2.5%. Low-fat diet and regular physical activity recommended for now. Follow-up in 6 months.  Vitamin D deficiency, unspecified Continue ergocalciferol 50,000 units weekly. She has an lab appointment already arranged for 25 OH vit D.  Coccyalgia Chronic. I do not think imaging is needed for now given the fact there is no hx of recent trauma and stable.   Return in about 6 months (around 07/12/2020) for CPE.   Radley Barto G. [place], MD  [facility]. [place]  office.

## 2020-01-13 NOTE — Assessment & Plan Note (Signed)
Mild. Possible etiology discussed,?  Fatty liver. We will plan on repeating LFTs next visit + CRP + hepatitis screening. Low-fat diet and weight loss will help. Caution with alcohol intake, we discussed current recommendations for women.

## 2020-01-13 NOTE — Assessment & Plan Note (Signed)
We discussed current guidelines in regard to pharmacologic treatment. 10 years CVD score 2.5%. Low-fat diet and regular physical activity recommended for now. Follow-up in 6 months.

## 2020-01-13 NOTE — Assessment & Plan Note (Signed)
We discussed benefits of wt loss as well as adverse effects of obesity. Consistency with healthy diet and physical activity recommended. Daily brisk walking for 15-30 min as tolerated or U Tube classes, low impact.

## 2020-01-22 ENCOUNTER — Telehealth: Payer: Self-pay | Admitting: Family Medicine

## 2020-01-22 NOTE — Telephone Encounter (Signed)
[facility] HIM Dept faxed request for medical records to [facility] 01/22/20  [facility]

## 2020-02-04 ENCOUNTER — Ambulatory Visit: Payer: BC Managed Care – PPO | Attending: Internal Medicine

## 2020-02-04 DIAGNOSIS — Z23 Encounter for immunization: Secondary | ICD-10-CM

## 2020-02-04 NOTE — Progress Notes (Signed)
   Covid-19 Vaccination Clinic  Name:  Laura Clark    MRN: [id number] DOB: 1971/08/21  02/04/2020  [name] was observed post Covid-19 immunization for 15 minutes without incidence. She was provided with Vaccine Information Sheet and instruction to access the V-Safe system.   [name] was instructed to call 911 with any severe reactions post vaccine: Marland Kitchen Difficulty breathing  . Swelling of your face and throat  . A fast heartbeat  . A bad rash all over your body  . Dizziness and weakness    Immunizations Administered    Name Date Dose VIS Date Route   Pfizer COVID-19 Vaccine 02/04/2020  9:20 AM 0.3 mL 11/20/2019 Intramuscular   Manufacturer: [employer], [employer]   Lot: [id number]   NDC: [address]-2

## 2020-02-05 ENCOUNTER — Encounter: Payer: Self-pay | Admitting: Family Medicine

## 2020-02-08 ENCOUNTER — Encounter: Payer: Self-pay | Admitting: Obstetrics and Gynecology

## 2020-02-08 ENCOUNTER — Telehealth: Payer: Self-pay | Admitting: Obstetrics and Gynecology

## 2020-02-08 NOTE — Telephone Encounter (Signed)
Spoke with patient. Advised no new test results from [facility], last  Results 01/05/20. Advised MMG was uploaded to chart. Questions answered.   Encounter closed.

## 2020-02-08 NOTE — Telephone Encounter (Signed)
Patient sent the following correspondence through MyChart.  I keep receiving emails that I have new test results in my chart.  Is there a glitch in the system?  Thank you.

## 2020-03-01 ENCOUNTER — Ambulatory Visit: Payer: BC Managed Care – PPO | Attending: Internal Medicine

## 2020-03-01 DIAGNOSIS — Z23 Encounter for immunization: Secondary | ICD-10-CM

## 2020-03-01 NOTE — Progress Notes (Signed)
   Covid-19 Vaccination Clinic  Name:  Laura Clark    MRN: [id number] DOB: 1971-05-11  03/01/2020  [name] was observed post Covid-19 immunization for 15 minutes without incident. She was provided with Vaccine Information Sheet and instruction to access the V-Safe system.   [name] was instructed to call 911 with any severe reactions post vaccine: Marland Kitchen Difficulty breathing  . Swelling of face and throat  . A fast heartbeat  . A bad rash all over body  . Dizziness and weakness   Immunizations Administered    Name Date Dose VIS Date Route   Pfizer COVID-19 Vaccine 03/01/2020  8:37 AM 0.3 mL 11/20/2019 Intramuscular   Manufacturer: [employer], [employer]   Lot: [id number]   NDC: [address]-1

## 2020-03-15 DIAGNOSIS — F4323 Adjustment disorder with mixed anxiety and depressed mood: Secondary | ICD-10-CM | POA: Diagnosis not present

## 2020-03-29 DIAGNOSIS — F4323 Adjustment disorder with mixed anxiety and depressed mood: Secondary | ICD-10-CM | POA: Diagnosis not present

## 2020-04-19 DIAGNOSIS — F4323 Adjustment disorder with mixed anxiety and depressed mood: Secondary | ICD-10-CM | POA: Diagnosis not present

## 2020-04-26 DIAGNOSIS — F4323 Adjustment disorder with mixed anxiety and depressed mood: Secondary | ICD-10-CM | POA: Diagnosis not present

## 2020-05-03 DIAGNOSIS — F4323 Adjustment disorder with mixed anxiety and depressed mood: Secondary | ICD-10-CM | POA: Diagnosis not present

## 2020-05-17 DIAGNOSIS — F4323 Adjustment disorder with mixed anxiety and depressed mood: Secondary | ICD-10-CM | POA: Diagnosis not present

## 2020-05-24 ENCOUNTER — Telehealth: Payer: Self-pay | Admitting: Obstetrics and Gynecology

## 2020-05-24 DIAGNOSIS — F4323 Adjustment disorder with mixed anxiety and depressed mood: Secondary | ICD-10-CM | POA: Diagnosis not present

## 2020-06-01 NOTE — Telephone Encounter (Signed)
Opened in error

## 2020-06-07 DIAGNOSIS — F4323 Adjustment disorder with mixed anxiety and depressed mood: Secondary | ICD-10-CM | POA: Diagnosis not present

## 2020-06-13 ENCOUNTER — Telehealth: Payer: Self-pay | Admitting: Obstetrics and Gynecology

## 2020-06-13 DIAGNOSIS — R7989 Other specified abnormal findings of blood chemistry: Secondary | ICD-10-CM

## 2020-06-13 NOTE — Telephone Encounter (Signed)
Please contact patient to have her return for a lab visit to check her vitamin D level which was quite low earlier this year.  She came up in my reminder box in Epic.

## 2020-06-14 NOTE — Telephone Encounter (Signed)
Call to patient. Message given to patient as seen below from Dr. Edward Jolly. Patient scheduled for vitamin d level recheck on 06-21-20 at 0845. Patient agreeable to date and time of appointment. Future order present for lab work.   Encounter closed.

## 2020-06-21 ENCOUNTER — Other Ambulatory Visit: Payer: Self-pay

## 2020-06-21 ENCOUNTER — Other Ambulatory Visit (INDEPENDENT_AMBULATORY_CARE_PROVIDER_SITE_OTHER): Payer: BC Managed Care – PPO

## 2020-06-21 ENCOUNTER — Other Ambulatory Visit: Payer: BC Managed Care – PPO

## 2020-06-21 DIAGNOSIS — R7989 Other specified abnormal findings of blood chemistry: Secondary | ICD-10-CM | POA: Diagnosis not present

## 2020-06-21 DIAGNOSIS — F4323 Adjustment disorder with mixed anxiety and depressed mood: Secondary | ICD-10-CM | POA: Diagnosis not present

## 2020-06-21 DIAGNOSIS — E559 Vitamin D deficiency, unspecified: Secondary | ICD-10-CM | POA: Diagnosis not present

## 2020-06-22 LAB — VITAMIN D 25 HYDROXY (VIT D DEFICIENCY, FRACTURES): Vit D, 25-Hydroxy: 20.1 ng/mL — ABNORMAL LOW (ref 30.0–100.0)

## 2020-06-27 ENCOUNTER — Other Ambulatory Visit: Payer: Self-pay | Admitting: *Deleted

## 2020-06-27 DIAGNOSIS — R7989 Other specified abnormal findings of blood chemistry: Secondary | ICD-10-CM

## 2020-06-27 MED ORDER — VITAMIN D (ERGOCALCIFEROL) 1.25 MG (50000 UNIT) PO CAPS: 50000.0000 [IU] | ORAL_CAPSULE | ORAL | 0 refills | Status: DC

## 2020-10-07 ENCOUNTER — Other Ambulatory Visit: Payer: BC Managed Care – PPO

## 2020-10-31 ENCOUNTER — Other Ambulatory Visit: Payer: Self-pay

## 2020-10-31 ENCOUNTER — Other Ambulatory Visit (INDEPENDENT_AMBULATORY_CARE_PROVIDER_SITE_OTHER): Payer: BC Managed Care – PPO

## 2020-10-31 DIAGNOSIS — R7989 Other specified abnormal findings of blood chemistry: Secondary | ICD-10-CM

## 2020-10-31 NOTE — Progress Notes (Signed)
Vit D orders placed per Dr Edward Jolly on 06/13/20 phone encounter note.

## 2020-11-01 LAB — VITAMIN D 25 HYDROXY (VIT D DEFICIENCY, FRACTURES): Vit D, 25-Hydroxy: 24.3 ng/mL — ABNORMAL LOW (ref 30.0–100.0)

## 2020-11-02 ENCOUNTER — Other Ambulatory Visit: Payer: Self-pay | Admitting: Obstetrics and Gynecology

## 2020-11-02 ENCOUNTER — Telehealth: Payer: Self-pay | Admitting: Obstetrics and Gynecology

## 2020-11-02 DIAGNOSIS — E559 Vitamin D deficiency, unspecified: Secondary | ICD-10-CM

## 2020-11-02 DIAGNOSIS — R7989 Other specified abnormal findings of blood chemistry: Secondary | ICD-10-CM

## 2020-11-02 MED ORDER — VITAMIN D (ERGOCALCIFEROL) 1.25 MG (50000 UNIT) PO CAPS: 50000.0000 [IU] | ORAL_CAPSULE | ORAL | 0 refills | Status: AC

## 2020-11-02 NOTE — Telephone Encounter (Signed)
Returned call to patient. I left very detailed message on her voicemail with results per Dr.Silva's not below.  Please contact patient with results of vit D, which is improving but still low.  I recommend she continue the vit D 50,000 IU weekly for the next 3 months.  Please send a refill to her pharmacy.  She will need a lab visit again in 3 months.  I will place future orders.   Rx. Sent to pharmacy for Vit D 50,000 IU #12, 1 po weekly , 0R, to pharmacy on file. Advised patient to call to make lab appointment mid February to have Vit D level rechecked before taking last Vit D. Left message to call if any questions.

## 2020-11-02 NOTE — Telephone Encounter (Signed)
Patient saw her recent vitamin d results in MyChart and wonders if Dr.Silva will want her to remain on the vitamin d?

## 2021-02-01 ENCOUNTER — Ambulatory Visit: Payer: BC Managed Care – PPO | Admitting: Obstetrics and Gynecology
